# Patient Record
Sex: Male | Born: 1990 | Race: White | Hispanic: No | Marital: Single | Smoking: Never smoker
Health system: Southern US, Community
[De-identification: ages and names within clinical notes are randomized; demographics above are authoritative.]

---

## 2016-10-27 ENCOUNTER — Ambulatory Visit (INDEPENDENT_AMBULATORY_CARE_PROVIDER_SITE_OTHER): Payer: BLUE CROSS/BLUE SHIELD

## 2016-10-27 ENCOUNTER — Ambulatory Visit (INDEPENDENT_AMBULATORY_CARE_PROVIDER_SITE_OTHER): Payer: BLUE CROSS/BLUE SHIELD | Admitting: Orthopaedic Surgery

## 2016-10-27 DIAGNOSIS — M25562 Pain in left knee: Secondary | ICD-10-CM | POA: Diagnosis not present

## 2016-10-27 MED ORDER — LIDOCAINE HCL 1 % IJ SOLN
2.0000 mL | INTRAMUSCULAR | Status: AC | PRN
  Administered 2016-10-27: 2 mL

## 2016-10-27 MED ORDER — METHYLPREDNISOLONE ACETATE 40 MG/ML IJ SUSP
40.0000 mg | INTRAMUSCULAR | Status: AC | PRN
  Administered 2016-10-27: 40 mg via INTRA_ARTICULAR

## 2016-10-27 MED ORDER — BUPIVACAINE HCL 0.5 % IJ SOLN
2.0000 mL | INTRAMUSCULAR | Status: AC | PRN
  Administered 2016-10-27: 2 mL via INTRA_ARTICULAR

## 2016-10-27 NOTE — Progress Notes (Signed)
Office Visit Note   Patient: Raymond Guzman           Date of Birth: 02-Jan-1991           MRN: [id number] Visit Date: 10/27/2016              Requested by: No referring provider defined for this encounter. PCP: Daylene KatayamaGordnier, [place], PA   Assessment & Plan: Visit Diagnoses:  1. Acute pain of left knee     Plan: Overall impression is medial meniscal tear with possible anterior cruciate ligament tear. I aspirated 15 mL of bloody effusion and then injected cortisone today. MRI was ordered to assess for structural abnormalities. I will call the patient with the results. Crush and encouraged and answered.  Follow-Up Instructions: Return if symptoms worsen or fail to improve.   Orders:  Orders Placed This Encounter  Procedures  . XR KNEE 3 VIEW LEFT  . MR Knee Left w/o contrast   No orders of the defined types were placed in this encounter.     Procedures: Large Joint Inj Date/Time: 10/27/2016 10:48 AM Performed by: Tarry KosXU, Jaizon Deroos M Authorized by: Tarry KosXU, Kimiya Brunelle M   Consent Given by:  Patient Timeout: prior to procedure the correct patient, procedure, and site was verified   Indications:  Pain Location:  Knee Site:  L knee Prep: patient was prepped and draped in usual sterile fashion   Needle Size:  22 G Ultrasound Guidance: No   Fluoroscopic Guidance: No   Arthrogram: No   Medications:  2 mL lidocaine 1 %; 2 mL bupivacaine 0.5 %; 40 mg methylPREDNISolone acetate 40 MG/ML Aspirate amount (mL):  15 Aspirate:  Bloody Patient tolerance:  Patient tolerated the procedure well with no immediate complications     Clinical Data: No additional findings.   Subjective: Chief Complaint  Patient presents with  . Left Knee - Pain    Raymond Guzman is a 26 year old healthy male who comes in today for left knee injury that happened on July 4 while [place] the beach. He states that his left knee was planned in the sand and then his knee buckled inward and felt a pop and had immediate  searing pain and swelling. The swelling has improved slightly. He does feel like the knee is locking up. He does does endorse tingling numbness and burning. He was examined at a local orthopedist and was told to follow-up here.    Review of Systems  Constitutional: Negative.   All other systems reviewed and are negative.    Objective: Vital Signs: There were no vitals taken for this visit.  Physical Exam  Constitutional: He is oriented to person, place, and time. He appears well-developed and well-nourished.  HENT:  Head: Normocephalic and atraumatic.  Eyes: Pupils are equal, round, and reactive to light.  Neck: Neck supple.  Pulmonary/Chest: Effort normal.  Abdominal: Soft.  Musculoskeletal: Normal range of motion.  Neurological: He is alert and oriented to person, place, and time.  Skin: Skin is warm.  Psychiatric: He has a normal mood and affect. His behavior is normal. Judgment and thought content normal.  Nursing note and vitals reviewed.   Ortho Exam Left knee exam shows a small joint effusion. His range of motion is slightly limited in flexion due to the effusion. Collaterals are stable. He does have medial joint line tenderness. Pain at the joint line with McMurray testing. He has slight laxity with Lachman's testing with a firm endpoint. Anterior drawer is negative. Negative pivot shift.  Neurovascular intact distally. Specialty Comments:  No specialty comments available.  Imaging: Xr Knee 3 View Left  Result Date: 10/27/2016 No acute or structural findings    PMFS History: There are no active problems to display for this patient.  No past medical history on file.  No family history on file.  No past surgical history on file. Social History   Occupational History  . Not on file.   Social History Main Topics  . Smoking status: Not on file  . Smokeless tobacco: Not on file  . Alcohol use Not on file  . Drug use: Unknown  . Sexual activity: Not on file

## 2016-11-06 ENCOUNTER — Ambulatory Visit
Admission: RE | Admit: 2016-11-06 | Discharge: 2016-11-06 | Disposition: A | Discharge status: Home / Self Care | Payer: BLUE CROSS/BLUE SHIELD | Source: Ambulatory Visit | Attending: Orthopaedic Surgery | Admitting: Orthopaedic Surgery

## 2016-11-06 DIAGNOSIS — M25562 Pain in left knee: Secondary | ICD-10-CM

## 2016-11-26 ENCOUNTER — Ambulatory Visit (INDEPENDENT_AMBULATORY_CARE_PROVIDER_SITE_OTHER): Payer: BLUE CROSS/BLUE SHIELD | Admitting: Orthopaedic Surgery

## 2016-11-26 ENCOUNTER — Encounter (INDEPENDENT_AMBULATORY_CARE_PROVIDER_SITE_OTHER): Payer: Self-pay | Admitting: Orthopaedic Surgery

## 2016-11-26 DIAGNOSIS — M25562 Pain in left knee: Secondary | ICD-10-CM | POA: Diagnosis not present

## 2016-11-26 NOTE — Progress Notes (Signed)
   Office Visit Note   Patient: Raymond Guzman           Date of Birth: 02/06/1991           MRN: [id number] Visit Date: 11/26/2016              Requested by: Daylene KatayamaGordnier, [place], PA [id number] [place], [place] [id number] PCP: Daylene KatayamaGordnier, [place], PA   Assessment & Plan: Visit Diagnoses:  1. Acute pain of left knee     Plan: PSO brace was applied today. Recommend physical therapy for strengthening and rehabilitation. Questions encouraged and answered. Follow-up as needed.  Follow-Up Instructions: Return if symptoms worsen or fail to improve.   Orders:  No orders of the defined types were placed in this encounter.  No orders of the defined types were placed in this encounter.     Procedures: No procedures performed   Clinical Data: No additional findings.   Subjective: Chief Complaint  Patient presents with  . Left Knee - Pain, Follow-up    Patient follows up today for his left knee MRI. He is feeling better. He does have some discomfort when he is climbing stairs or in a deep squat. Otherwise he is feeling much better    Review of Systems   Objective: Vital Signs: There were no vitals taken for this visit.  Physical Exam  Ortho Exam Left knee exam shows no joint effusion. Good range of motion. Specialty Comments:  No specialty comments available.  Imaging: No results found.   PMFS History: There are no active problems to display for this patient.  No past medical history on file.  No family history on file.  No past surgical history on file. Social History   Occupational History  . Not on file.   Social History Main Topics  . Smoking status: Never Smoker  . Smokeless tobacco: Never Used  . Alcohol use Not on file  . Drug use: Unknown  . Sexual activity: Not on file

## 2019-06-21 IMAGING — MR MR KNEE*L* W/O CM
4 of 6 series · 16 of 40 positions shown · non-contrast
Comparison: None.

CLINICAL DATA: Left knee pain after an injury playing soccer on
10/21/2016.

EXAM:
MRI OF THE LEFT KNEE WITHOUT CONTRAST
TECHNIQUE: Multiplanar, multisequence MR imaging of the knee was performed. No
intravenous contrast was administered.

[Series 4: PD fat-sat · sagittal · 3.5mm · 0.21mm/px · 6 of 29 slices shown (1 of 3)]
[im 1/29]
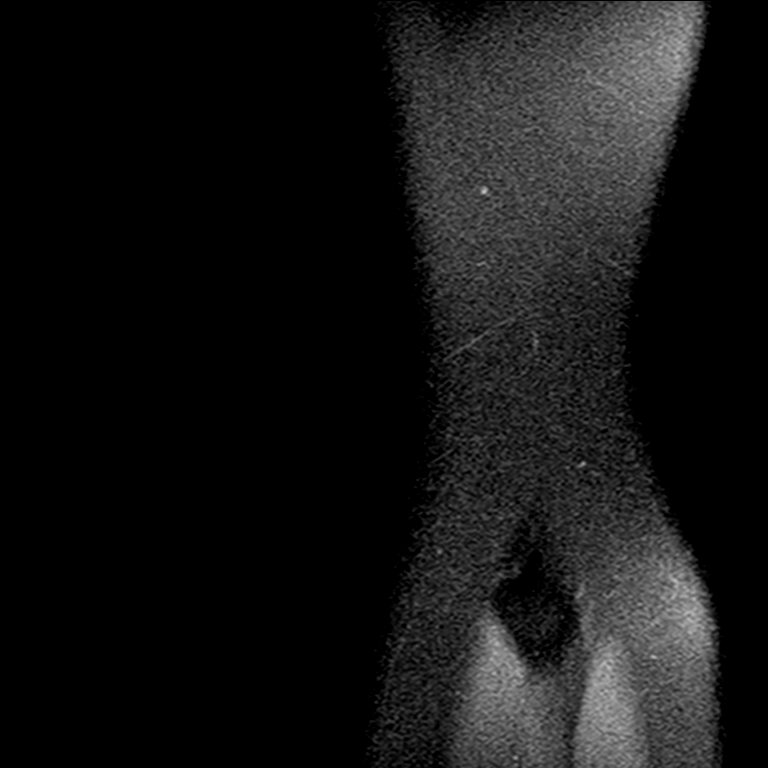
[im 6/29]
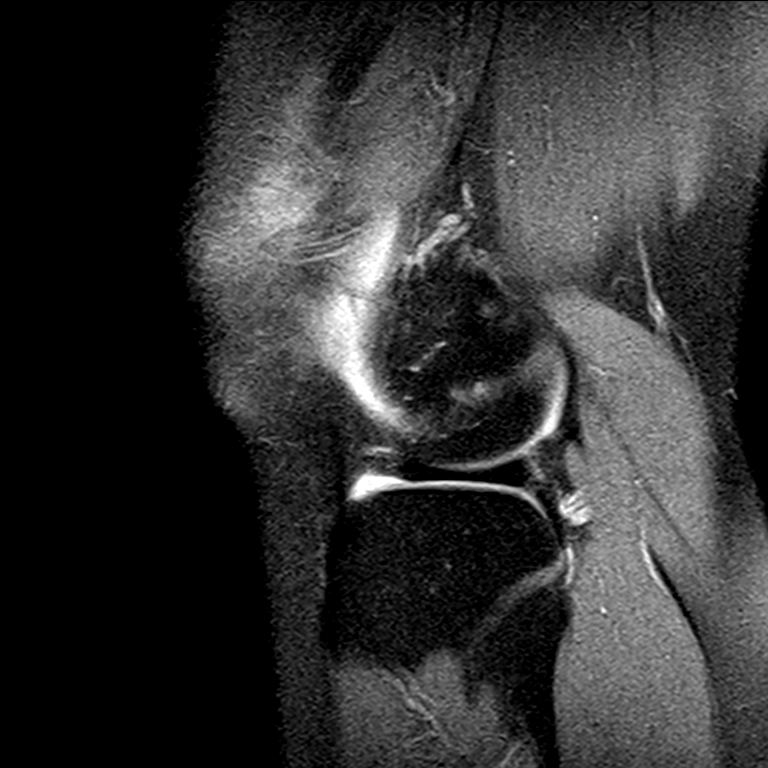
[im 12/29]
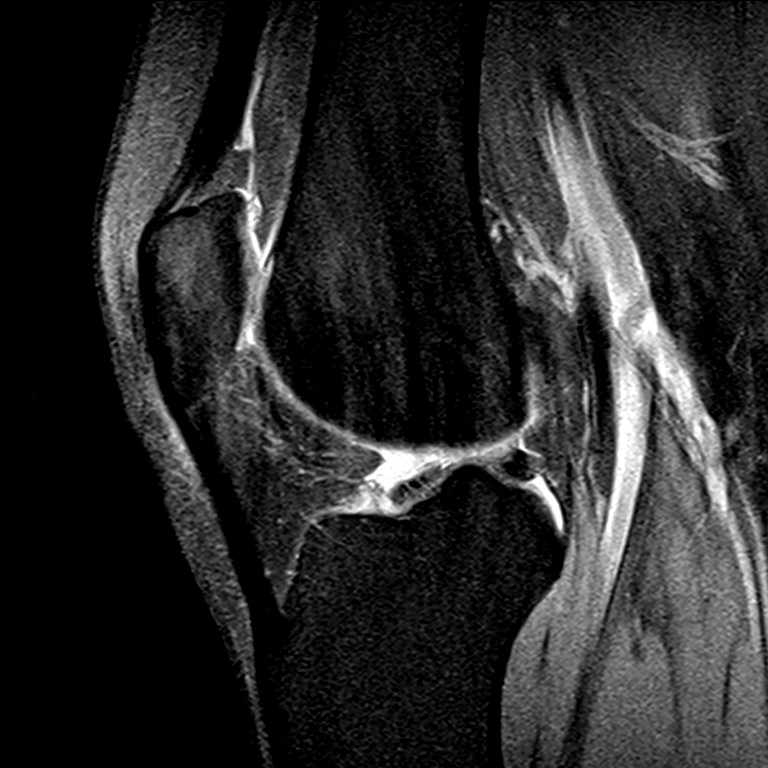
[im 17/29]
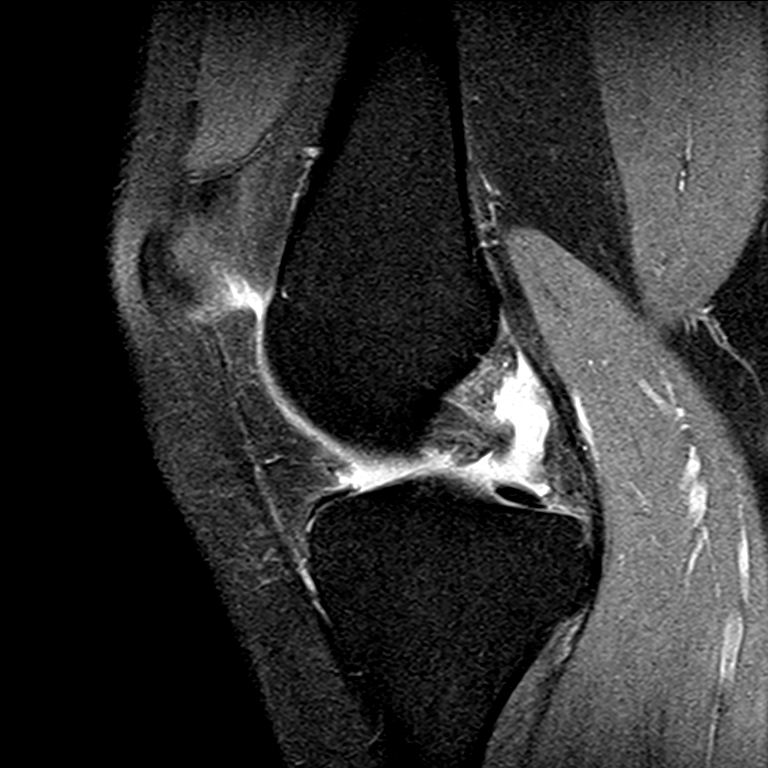
[im 23/29]
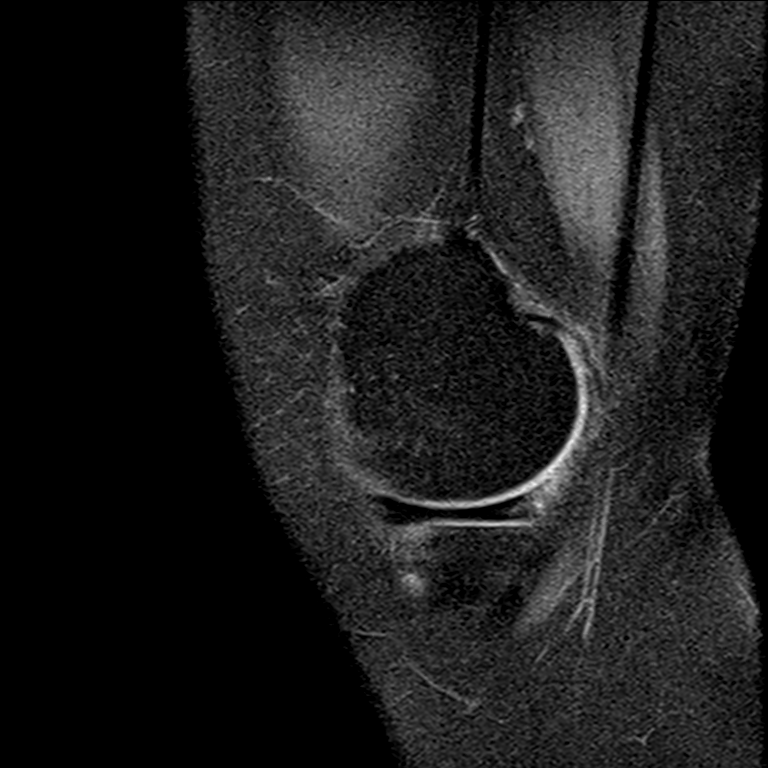
[im 29/29]
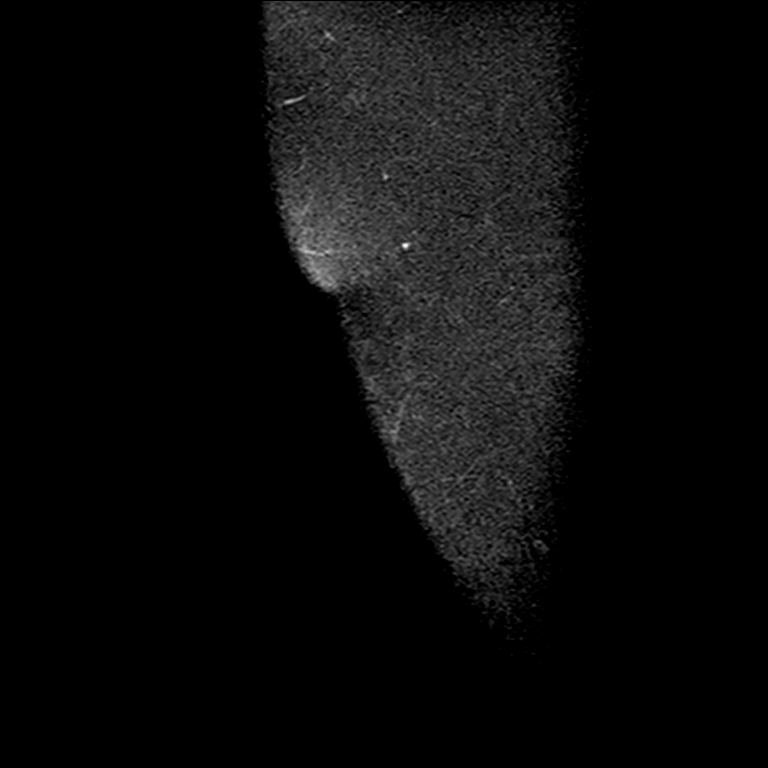

[Series 5: T2 fat-sat · coronal · 3.5mm · 0.50mm/px · 3 of 35 slices shown]
[im 6/35]
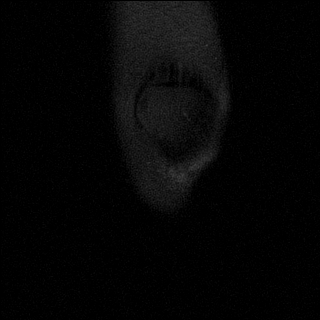
[im 18/35]
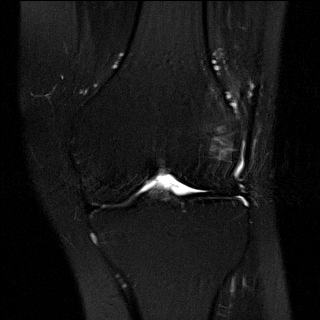
[im 29/35]
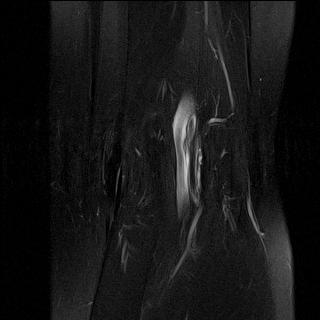

[Series 7: PD fat-sat · coronal · 3.5mm · 0.42mm/px · 4 of 35 slices shown (2 of 3)]
[im 1/35]
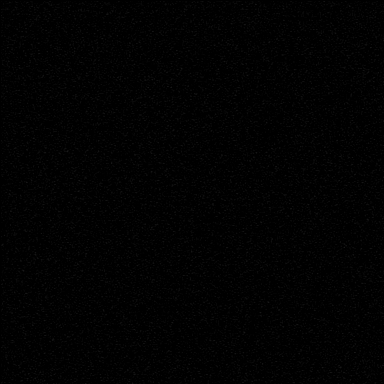
[im 6/35]
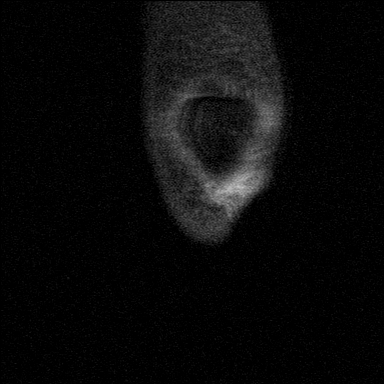
[im 18/35]
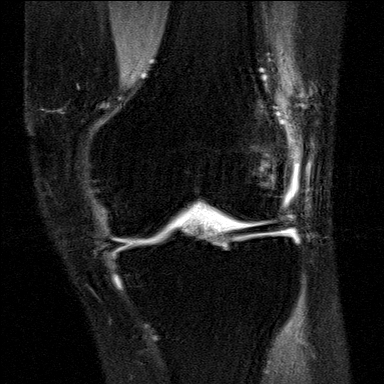
[im 29/35]
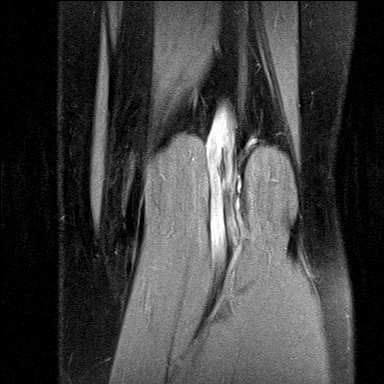

[Series 8: PD fat-sat · coronal · 2.0mm · 0.29mm/px · 3 of 22 slices shown (3 of 3)]
[im 1/22]
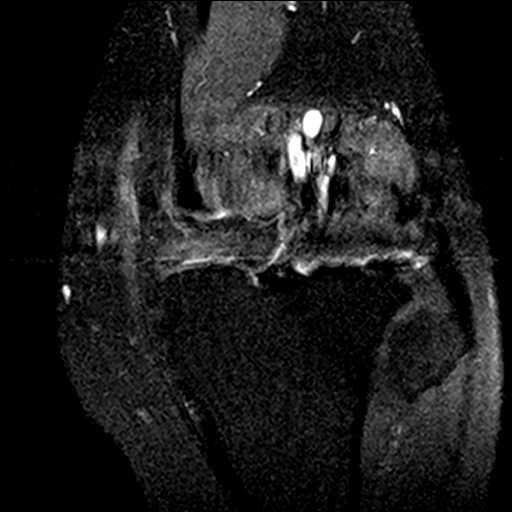
[im 11/22]
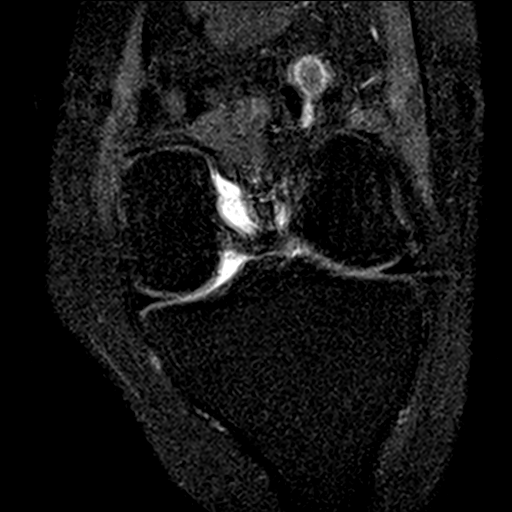
[im 22/22]
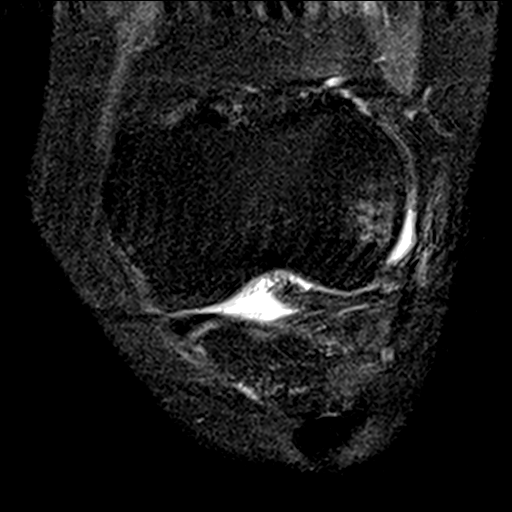

[16 of 40 positions shown; findings below may reference images not displayed]

FINDINGS: MENISCI

Medial meniscus:  Normal.

Lateral meniscus:  Normal.

LIGAMENTS

Cruciates:  Intact ACL and PCL.

Collaterals: Medial collateral ligament is intact. Lateral
collateral ligament complex is intact.

CARTILAGE

Patellofemoral:  Intact.

Medial:  Intact.

Lateral:  Intact.

Joint:  Minimal joint effusion.

Popliteal Fossa:  Negative.

Extensor Mechanism: Intact. No edema surrounding the medial
patellofemoral ligament.

Bones: Prominent bone contusion of the lateral aspect of the lateral
femoral condyle and of the inferior medial aspect of the patella
consistent with transient lateral dislocation of the patella.

Other: None
IMPRESSION: Findings of recent transient lateral dislocation of the patella with
bone contusions of the patella and lateral femoral condyle. No
appreciable edema of the medial patellofemoral ligament.
